# Patient Record
Sex: Female | Born: 2005 | Race: White | Hispanic: No | Marital: Single | Smoking: Never smoker
Health system: Southern US, Community
[De-identification: ages and names within clinical notes are randomized; demographics above are authoritative.]

## PROBLEM LIST (undated history)

## (undated) HISTORY — PX: NO PAST SURGERIES: SHX2092

---
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
## 2025-01-04 ENCOUNTER — Telehealth: Payer: Self-pay

## 2025-01-04 ENCOUNTER — Ambulatory Visit: Admitting: Pediatrics

## 2025-01-04 DIAGNOSIS — Z113 Encounter for screening for infections with a predominantly sexual mode of transmission: Secondary | ICD-10-CM

## 2025-01-04 DIAGNOSIS — Z0001 Encounter for general adult medical examination with abnormal findings: Secondary | ICD-10-CM

## 2025-01-04 NOTE — Telephone Encounter (Signed)
 Called patient in attempt to reschedule no showed appointment. Call could not be completed to reschedule missed appointment. No show letter mailed.  Parent informed of Careers Information Officer of [employer]. No Show Policy states that failure to cancel or reschedule an appointment without giving at least 24 hours notice is considered a No Show.  As our policy states, if a patient has recurring no shows, then they may be discharged from the practice. Because they have now missed an appointment, this a verbal notification of the potential discharge from the practice if more appointments are missed. If discharge occurs, [facility] will mail a letter to the patient/parent for notification. Parent/caregiver verbalized understanding of policy.

## 2025-02-09 ENCOUNTER — Encounter
